# Patient Record
Sex: Female | Born: 1990 | Hispanic: Yes | Smoking: Never smoker
Health system: Southern US, Community
[De-identification: ages and names within clinical notes are randomized; demographics above are authoritative.]

## PROBLEM LIST (undated history)

## (undated) DIAGNOSIS — Z789 Other specified health status: Secondary | ICD-10-CM

## (undated) HISTORY — DX: Other specified health status: Z78.9

---

## 2012-01-04 ENCOUNTER — Inpatient Hospital Stay: Payer: Self-pay

## 2012-01-04 LAB — CBC WITH DIFFERENTIAL/PLATELET
Basophil %: 0.2 %
Eosinophil %: 0.6 %
HCT: 35.4 % (ref 35.0–47.0)
HGB: 12.3 g/dL (ref 12.0–16.0)
Lymphocyte #: 1.4 10*3/uL (ref 1.0–3.6)
Lymphocyte %: 12.1 %
MCH: 31.1 pg (ref 26.0–34.0)
MCHC: 34.8 g/dL (ref 32.0–36.0)
Monocyte #: 0.9 x10 3/mm (ref 0.2–0.9)
Monocyte %: 7.4 %
Neutrophil %: 79.7 %
Platelet: 168 10*3/uL (ref 150–440)
RBC: 3.96 10*6/uL (ref 3.80–5.20)

## 2012-01-07 LAB — CBC WITH DIFFERENTIAL/PLATELET
Basophil #: 0 10*3/uL (ref 0.0–0.1)
Basophil %: 0.1 %
Eosinophil #: 0 10*3/uL (ref 0.0–0.7)
HCT: 33.4 % — ABNORMAL LOW (ref 35.0–47.0)
HGB: 11.3 g/dL — ABNORMAL LOW (ref 12.0–16.0)
Lymphocyte %: 14.8 %
MCHC: 33.9 g/dL (ref 32.0–36.0)
Monocyte %: 11.3 %
Neutrophil #: 8.8 10*3/uL — ABNORMAL HIGH (ref 1.4–6.5)
Neutrophil %: 73.5 %
WBC: 12 10*3/uL — ABNORMAL HIGH (ref 3.6–11.0)

## 2012-01-09 LAB — CBC WITH DIFFERENTIAL/PLATELET
Eosinophil #: 0.1 10*3/uL (ref 0.0–0.7)
Lymphocyte %: 13.4 %
MCHC: 33.1 g/dL (ref 32.0–36.0)
MCV: 90 fL (ref 80–100)
Monocyte #: 1.2 x10 3/mm — ABNORMAL HIGH (ref 0.2–0.9)
Monocyte %: 8.5 %
Neutrophil %: 77 %
Platelet: 174 10*3/uL (ref 150–440)
RDW: 13.1 % (ref 11.5–14.5)
WBC: 14.6 10*3/uL — ABNORMAL HIGH (ref 3.6–11.0)

## 2012-01-10 LAB — HEMATOCRIT: HCT: 32 % — ABNORMAL LOW (ref 35.0–47.0)

## 2012-01-11 LAB — CBC WITH DIFFERENTIAL/PLATELET
Basophil #: 0 10*3/uL (ref 0.0–0.1)
Basophil %: 0.1 %
Eosinophil #: 0.2 10*3/uL (ref 0.0–0.7)
MCV: 90 fL (ref 80–100)
Monocyte #: 1.8 x10 3/mm — ABNORMAL HIGH (ref 0.2–0.9)
Monocyte %: 11 %
Neutrophil #: 13.1 10*3/uL — ABNORMAL HIGH (ref 1.4–6.5)
Neutrophil %: 79.6 %
Platelet: 165 10*3/uL (ref 150–440)
RBC: 3.4 10*6/uL — ABNORMAL LOW (ref 3.80–5.20)
RDW: 13.2 % (ref 11.5–14.5)
WBC: 16.4 10*3/uL — ABNORMAL HIGH (ref 3.6–11.0)

## 2012-01-17 ENCOUNTER — Emergency Department: Payer: Self-pay | Admitting: Emergency Medicine

## 2014-09-12 NOTE — Op Note (Signed)
PATIENT NAME:  Kristin Haley, Kristin Haley MR#:  [id number] DATE OF BIRTH:  07-20-1990  DATE OF PROCEDURE:  01/09/2012  PREOPERATIVE DIAGNOSES:   1. Premature preterm rupture of membranes. 2. 34 + 5 weeks. 3. Oligohydramnios. 4. Active labor. 5. Breech presentation.   POSTOPERATIVE DIAGNOSES:  1. Premature preterm rupture of membranes. 2. 34 + 5 weeks. 3. Oligohydramnios. 4. Active labor. 5. Breech presentation.   PROCEDURE: Primary low transverse cesarean section.   ANESTHESIA: Spinal.   SURGEON: Suzy Bouchardhomas J Schermerhorn, M.D.   FIRST ASSISTANT: Street, Stage managerscrub tech.   INDICATION: The patient is an 24 year old gravida 1, para 0 patient admitted to the hospital at 34 + 0 weeks with PPROM.  The patient was observed for the next five days and the day of the procedure the patient started contracting and continued to leak fluid. Ultrasound documented amniotic fluid index to be 4.1. Cervix was checked and cervix was 5 cm dilated, complete, -1 station. The fetus was in the frank breech presentation.   PROCEDURE: After adequate spinal anesthesia, the patient received 2 grams of IV Ancef prior to commencement of the case. A Pfannenstiel incision was made two fingerbreadths above the symphysis pubis. Sharp dissection was used to identify the fascia. The fascia was opened in the midline and opened in a transverse fashion. The superior aspect of the fascia was grasped with Kocher clamps and the recti muscles dissected free. The inferior aspect of the fascia was grasped with Kocher clamps. Pyramidalis muscle was dissected free. Entry into the peritoneal cavity was accomplished sharply. The vesicouterine peritoneal fold was identified and opened and the bladder was reflected inferiorly. A low transverse uterine incision was made. Upon entry into the endometrial cavity, clear fluid resulted. Fetal buttocks were was brought to the incision and legs were delivered from the frank breech position. Buttocks and legs were  grasped with a wound towel and the arms were delivered without difficulty. The head was delivered by the Mauriceau-Smellie-Veit maneuver. A small vigorous female's cord was doubly clamped and the infant was passed to Dr. Beckie Saltsasnadi who assigned Apgar scores of 9 and 9. The placenta was manually delivered. The uterus was exteriorized and the endometrial cavity was wiped clean with laparotomy tape. Ring forceps was used to open the cervix. This was passed off the operative field. The uterine incision was closed with 1 chromic suture in a running locking fashion with good approximation of edges. Good hemostasis was noted. The fallopian tubes and ovaries appeared normal bilaterally. The posterior cul-de-sac was irrigated and suctioned. The uterus was placed back into the abdominal cavity and good hemostasis again noted at the uterine incision. The paracolic gutters were wiped clean with laparotomy tape. The On-Q pump catheters were then brought up to the operative field and advanced inferior to the umbilicus. The fascia was then closed over the top of these previously placed catheters. The fascia was reapproximated. Good hemostasis and good approximation noted. Subcutaneous tissues were irrigated and bovied for hemostasis, and the skin was reapproximated with staples. The On-Q pump catheters were secured at the skin level with Dermabond and were attached to the skin with Steri-Strips. They were both loaded with 0.5% Marcaine, 5 mL each. Tegaderm was placed over the catheters. The patient tolerated the procedure well.  Estimated blood loss was 300 mL. Intraoperative fluids 1400 mL. There were no complications. The patient was taken to the recovery room in good condition.    ____________________________ Suzy Bouchardhomas J. Schermerhorn, MD tjs:bjt D: 01/09/2012 14:10:42 ET T: 01/09/2012 14:54:22 ET  JOB#: [id number]  cc: Suzy Bouchard, MD, <Dictator> Suzy Bouchard MD ELECTRONICALLY SIGNED 01/10/2012 11:19

## 2014-09-12 NOTE — Consult Note (Signed)
    Maternal Age 24    Gravida 1    Para 0    Term Deliveries 0    Preterm Deliveries 0    Abortions 0    Living Children 0    Final EDD (dd-mmm-yy) 16-Jan-2012    Gestational Age (wk, days based on mom's dates) 7734    Gestation Single    Blood Type (Maternal) O positive    Antibody Screen Results (Maternal) negative    Gonorrhea Results (Maternal) negative    Chlamydia Results (Maternal) negative    VDRL/RPR/Syphilis Results (Maternal) negative    Varicella Titer Results (Maternal) Positive    Rubella Results (Maternal) immune    Hepatitis B Surface Antigen Results (Maternal) negative    Group B Strep Results Maternal (Result >5wks must be treated as unknown) unknown/result > 5 weeks ago    Prenatal Care Adequate    Family/Social History Prenatal care adequate, but started late at 1220 wks gestation age.     Additional Comments Consulted to see this 24 yo G1PO mother who is in preterm premature labor with a 34 0/7 wks baby girl. I discussed with mother that the infant has a much greater than 90% chance of survival and a <10% chance of developing neurodevelopmental problems. We discussed that the eyes, lungs, and brain are all relatively well developed at this gestation age. We discussed the delivery, the possible need for resuscitation, the NICU, lines, endotracheal tubes, and ventilators. We discussed that the majority of this infants will have difficulty Po feeding at this age and that this would be the major hurdle to overcome. We discussed the length of admission 2 weeks plus/minus her due date. I answered all of the parents and grandparents questions. During my consult we used a translator to help with the grandparents understanding. Father is present but does not speak [place].  Thank you for the opportunity to consult with this wonderful family.   Recommendations to OBGYN 1. Betamethasone x 2 if possible 2. No need for magnesium for neuroprotection since infant is >  [redacted] weeks gestation age.  Total time 45 minutes.    Parental Contact Consulted with Obstetrician regarding treatment options, Parents aware of plan and care   Electronic Signatures: Corliss ParishGaliote, Chandrea Zellman P (MD)  (Signed 12-Aug-13 00:19)  Authored: PREGNANCY and LABOR, ADDITIONAL COMMENTS   Last Updated: 12-Aug-13 00:19 by Corliss ParishGaliote, Lyndell Allaire P (MD)

## 2014-09-12 NOTE — Discharge Summary (Signed)
PATIENT NAME:  Kristin Haley, Kristin Haley MR#:  [id number] DATE OF BIRTH:  1991/05/06  DATE OF ADMISSION:  01/04/2012 DATE OF DISCHARGE:  01/12/2012  HOSPITAL COURSE: 24 year old gravida 1, para 0 admitted to [facility], had preterm rupture of membranes at 34 + 5 weeks. The patient was observed for five days. On the day of surgery, the patient was contracting. Cervix was dilated to 5 cm. Amniotic fluid index of 4.1. Fetus in the frank breech presentation. The patient's white blood count was elevated.  PRINCIPLE PROCEDURE: Primary low transverse cesarean section.   Postoperatively, the patient did well. The patient remained afebrile for postoperative course. Postoperative hematocrit 32%. The patient was discharged to home in good condition on postoperative day #3. The patient will follow-up with Dr. Feliberto GottronSchermerhorn in two weeks for wound care.  DISCHARGE MEDICATIONS:  1. Prenatal vitamins. 2. Norco. 3. Ibuprofen.   The patient will return before two weeks for nausea, vomiting, increasing abdominal pain or fever.   ____________________________ Suzy Bouchardhomas J. Enna Warwick, MD tjs:ap D: 01/19/2012 10:21:36 ET T: 01/19/2012 13:02:45 ET JOB#: [id number]  cc: Suzy Bouchardhomas J. Raunel Dimartino, MD, <Dictator>  Suzy BouchardHOMAS J Bay Jarquin MD ELECTRONICALLY SIGNED 01/21/2012 8:58

## 2014-10-03 NOTE — H&P (Signed)
L&D Evaluation:  History:   HPI 24 y/o G1 @ 34wks EDC 02/16/12 (dated by 24 wk US) arrives with c/o leaking large amount of fluid at 2030. Irreg mild uc's, denies vaginal bleeding, baby is active. Care @ [facility], adolescent, late entry to care @ 20 wks, GBS unknown.    Presents with contractions, leaking fluid    Patient's Medical History No Chronic Illness    Patient's Surgical History none    Medications Pre Natal Vitamins    Allergies NKDA    Social History none    Family History Non-Contributory   Exam:   Vital Signs stable    Urine Protein not completed    General no apparent distress    Mental Status clear    Chest clear    Heart normal sinus rhythm    Abdomen gravid, non-tender    Estimated Fetal Weight Average for gestational age    Fetal Position vtx    Fundal Height AGA    Edema no edema    Reflexes 1+    Clonus negative    Pelvic no external lesions, 1cm 50% vtx @ -2 cleear fluid sm show    Description clear    FHT normal rate with no decels, 150's 160's baseline avg variability    Fetal Heart Rate 152    Ucx irregular, Q 2/3 mins 45 sec mild/mod    Skin dry    Lymph no lymphadenopathy   Impression:   Impression early labor, PPROM   Plan:   Plan EFM/NST, antibiotics for GBBS prophylaxis    Comments Admitted, explained plan of care and what to expect with preterm delivery and first baby. Questions answered for pt and family. DC pain management options. Dr Logan BoresEvans consulted, Dr Kurtis BushmanGalliotte consulted as well. Betamethasone given, IV ABX begun. No SVE.   Electronic Signatures: Albertina ParrLugiano, Inita Uram B (CNM)  (Signed 11-Aug-13 22:36)  Authored: L&D Evaluation   Last Updated: 11-Aug-13 22:36 by Albertina ParrLugiano, Thelmer Legler B (CNM)

## 2016-06-16 LAB — HM PAP SMEAR: HM Pap smear: NEGATIVE

## 2017-12-29 ENCOUNTER — Other Ambulatory Visit: Payer: Self-pay | Admitting: Nurse Practitioner

## 2017-12-29 DIAGNOSIS — Z3481 Encounter for supervision of other normal pregnancy, first trimester: Secondary | ICD-10-CM | POA: Diagnosis not present

## 2017-12-29 DIAGNOSIS — Z369 Encounter for antenatal screening, unspecified: Secondary | ICD-10-CM

## 2018-01-28 ENCOUNTER — Other Ambulatory Visit: Payer: Self-pay | Admitting: Obstetrics and Gynecology

## 2018-01-28 DIAGNOSIS — Z369 Encounter for antenatal screening, unspecified: Secondary | ICD-10-CM

## 2018-02-01 ENCOUNTER — Ambulatory Visit
Admission: RE | Admit: 2018-02-01 | Discharge: 2018-02-01 | Disposition: A | Discharge status: Home / Self Care | Payer: 59 | Source: Ambulatory Visit | Attending: Obstetrics and Gynecology | Admitting: Obstetrics and Gynecology

## 2018-02-01 ENCOUNTER — Ambulatory Visit: Payer: Self-pay

## 2018-02-01 ENCOUNTER — Ambulatory Visit
Admission: RE | Admit: 2018-02-01 | Discharge: 2018-02-01 | Disposition: A | Discharge status: Home / Self Care | Payer: 59 | Source: Ambulatory Visit | Attending: Nurse Practitioner | Admitting: Nurse Practitioner

## 2018-02-01 DIAGNOSIS — Z3689 Encounter for other specified antenatal screening: Secondary | ICD-10-CM | POA: Diagnosis not present

## 2018-02-01 DIAGNOSIS — Z315 Encounter for genetic counseling: Secondary | ICD-10-CM | POA: Insufficient documentation

## 2018-02-01 DIAGNOSIS — Z87898 Personal history of other specified conditions: Secondary | ICD-10-CM

## 2018-02-01 DIAGNOSIS — Z3A13 13 weeks gestation of pregnancy: Secondary | ICD-10-CM | POA: Insufficient documentation

## 2018-02-01 DIAGNOSIS — Z369 Encounter for antenatal screening, unspecified: Secondary | ICD-10-CM

## 2018-02-01 NOTE — Progress Notes (Signed)
Length of Consultation: 30 minutes   Kristin Haley  was referred to [facility] for genetic counseling to review prenatal screening and testing options.  This note summarizes the information we discussed.    Genetic Counseling We offered the following routine screening tests for this pregnancy:  First trimester screening, which includes nuchal translucency ultrasound screen and first trimester maternal serum marker screening.  The nuchal translucency has approximately an 80% detection rate for Down syndrome and can be positive for other chromosome abnormalities as well as congenital heart defects.  When combined with a maternal serum marker screening, the detection rate is up to 90% for Down syndrome and up to 97% for trisomy 18.     Maternal serum marker screening, a blood test that measures pregnancy proteins, can provide risk assessments for Down syndrome, trisomy 18, and open neural tube defects (spina bifida, anencephaly). Because it does not directly examine the fetus, it cannot positively diagnose or rule out these problems.  Targeted ultrasound uses high frequency sound waves to create an image of the developing fetus.  An ultrasound is often recommended as a routine means of evaluating the pregnancy.  It is also used to screen for fetal anatomy problems (for example, a heart defect) that might be suggestive of a chromosomal or other abnormality.   Should these screening tests indicate an increased concern, then the following additional testing options would be offered:  The chorionic villus sampling procedure is available for first trimester chromosome analysis.  This involves the withdrawal of a small amount of chorionic villi (tissue from the developing placenta).  Risk of pregnancy loss is estimated to be approximately 1 in 200 to 1 in 100 (0.5 to 1%).  There is approximately a 1% (1 in 100) chance that the CVS chromosome results will be unclear.  Chorionic  villi cannot be tested for neural tube defects.     Amniocentesis involves the removal of a small amount of amniotic fluid from the sac surrounding the fetus with the use of a thin needle inserted through the maternal abdomen and uterus.  Ultrasound guidance is used throughout the procedure.  Fetal cells from amniotic fluid are directly evaluated and > 99.5% of chromosome problems and > 98% of open neural tube defects can be detected. This procedure is generally performed after the 15th week of pregnancy.  The main risks to this procedure include complications leading to miscarriage in less than 1 in 200 cases (0.5%).  As another option for information if the pregnancy is suspected to be an an increased chance for certain chromosome conditions, we also reviewed the availability of cell free fetal DNA testing from maternal blood to determine whether or not the baby may have either Down syndrome, trisomy 49, or trisomy 49.  This test utilizes a maternal blood sample and DNA sequencing technology to isolate circulating cell free fetal DNA from maternal plasma.  The fetal DNA can then be analyzed for DNA sequences that are derived from the three most common chromosomes involved in aneuploidy, chromosomes 13, 18, and 21.  If the overall amount of DNA is greater than the expected level for any of these chromosomes, aneuploidy is suspected.  While we do not consider it a replacement for invasive testing and karyotype analysis, a negative result from this testing would be reassuring, though not a guarantee of a normal chromosome complement for the baby.  An abnormal result is certainly suggestive of an abnormal chromosome complement, though we would  still recommend CVS or amniocentesis to confirm any findings from this testing.  Cystic Fibrosis and Spinal Muscular Atrophy (SMA) screening were also discussed with the patient. These conditions are recessive, which means that both parents must be carriers in order to  have a child with the disease.  Cystic fibrosis (CF) is one of the most common genetic conditions in persons of Caucasian ancestry.  This condition results in thickened secretions in the lungs, digestive, and reproductive systems.  For a baby to be at risk for having CF, both of the parents must be carriers for this condition.  Approximately 1 in 13 Hispanic persons is a carrier for CF.  Current carrier testing looks for the most common mutations in the gene for CF and can detect approximately 72% of carriers in the Hispanic population.  This means that the carrier screening can greatly reduce, but cannot eliminate, the chance for an individual to have a child with CF.  If an individual is found to be a carrier for CF, then carrier testing would be available for the partner. As part of [place]'s newborn screening profile, all babies born in the state of [place] will have a two-tier screening process.  Specimens are first tested to determine the concentration of immunoreactive trypsinogen (IRT).  The top 5% of specimens with the highest IRT values then undergo DNA testing using a panel of over 40 common CF mutations. SMA is a neurodegenerative disorder that leads to atrophy of skeletal muscle and overall weakness.  In the Hispanc[place] population 1 in 117 persons are carriers for SMA, with an approximately 91% detection rate. There are multiple forms of the disease, with some causing death in infancy to other forms with survival into adulthood.  The genetics of SMA is complex, but carrier screening can detect up to 95% of carriers in the Caucasian population.  Similar to CF, a negative result can greatly reduce, but cannot eliminate, the chance to have a child with SMA.   Hemoglobinopathy carrier testing was also discussed. Hemoglobinopathies include conditions such as sickle cell anemia, alpha thalassemia, and beta thalassemia that involve disordered hemoglobin structure. Sickle cell anemia impacts blood  cell structure, which can lead to vasoocclusive crises and acute chest syndrome. Thalassemias are disorders of reduced globin chain formation, and features vary by genotype. These are autosomal recessive conditions, which means both parents must be carriers in order to have a child with the disease. Individuals who are carriers of two different hemoglobin structure abnormalities, such as having sickle cell trait and be a carrier for beta thalassemia, may also present with abnormal phenotypes. If an individual is found to be a carrier for one of these conditions, then testing in their partner would be available. A negative result can greatly reduce, but cannot eliminate, the chance to have a child with a hemoglobinopathy. All babies in [place] are screened at birth for abnormal hemoglobin S, C, D, and E using isoelectric focusing and high performance liquid chromatography. 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   Pregnancy/Family History We obtained a detailed family history and pregnancy history. This is Kristin Haley second pregnancy, G2P0101. Kristin Haley reported no pregnancy complications such as bleeding or infection. She reported no drug, tobacco, or alcohol use during pregnancy. Kristin Haley current partner has no other children, and manages his hypertension via medication. Her first pregnancy was with a different partner. Her daughter was born  preterm, but is now six with no reported concerns. The remainder of the family history was reported to be unremarkable for birth defects, intellectual delays, recurrent pregnancy loss or known chromosome abnormalities. Kristin Haley and her partner are both Hispanic. Consanguinity is denied.   Plan After consideration of the options, Kristin Haley elected to proceed with first trimester screening, and carrier screening for CF, SMA, and hemoglobinopathies. Results will be disclosed over the  phone with Kristin Haley.   An ultrasound was performed at the time of the visit.  The gestational age was consistent with [redacted]w[redacted]d.  The nuchal translucency measured 2.80mm. Fetal anatomy could not be assessed due to early gestational age.  Please refer to the ultrasound report for details of that study.  Kristin Haley was encouraged to call with questions or concerns.  We can be contacted at [phone].  Labs ordered: -First trimester screen -Carrier screening for SMA, CF, hemoglobinopathies   I saw the patient and agree with plan as outlined by the genetic counselor  Jimmey Ralph, MD

## 2018-02-02 LAB — HEMOGLOBINOPATHY EVALUATION
HGB A2 QUANT: 2.4 % (ref 1.8–3.2)
HGB F QUANT: 0 % (ref 0.0–2.0)
Hgb A: 97.6 % (ref 96.4–98.8)
Hgb C: 0 %
Hgb S Quant: 0 %
Hgb Variant: 0 %

## 2018-02-04 ENCOUNTER — Telehealth: Payer: Self-pay | Admitting: Obstetrics and Gynecology

## 2018-02-04 NOTE — Telephone Encounter (Signed)
  Ms. Irving ShowsGuido elected to undergo First Trimester screening on 02/01/2018.  To review, first trimester screening, includes nuchal translucency ultrasound screen and/or first trimester maternal serum marker screening.  The nuchal translucency has approximately an 80% detection rate for Down syndrome and can be positive for other chromosome abnormalities as well as heart defects.  When combined with a maternal serum marker screening, the detection rate is up to 90% for Down syndrome and up to 97% for trisomy 13 and 18.     The results of the First Trimester Nuchal Translucency and Biochemical Screening were within normal range.  The risk for Down syndrome is now estimated to be less than 1 in 10,000.  The risk for Trisomy 13/18 is also estimated to be less than 1 in 10,000.  Should more definitive information be desired, we would offer amniocentesis.  Because we do not yet know the effectiveness of combined first and second trimester screening, we do not recommend a maternal serum screen to assess the chance for chromosome conditions.  However, if screening for neural tube defects is desired, maternal serum screening for AFP only can be performed between 15 and [redacted] weeks gestation.    The patient also elected to have carrier screening for hemoglobinopathies, CF and SMA.  The results of the hemoglobinopathy testing are available and are normal, showing normal adult hemoglobin (AA).  The results of the CF and SMA testing should be available within 2 weeks and will be conveyed to the patient.  Cherly Andersoneborah F. Tearah Saulsbury, MS, CGC

## 2018-02-09 LAB — SMN1 COPY NUMBER ANALYSIS (SMA CARRIER SCREENING)

## 2018-02-09 LAB — CYSTIC FIBROSIS GENE TEST

## 2018-02-11 ENCOUNTER — Telehealth: Payer: Self-pay | Admitting: Obstetrics and Gynecology

## 2018-02-11 NOTE — Telephone Encounter (Signed)
  We have informed Ms. Kristin Haley that the results of the recent screening tests for Cystic fibrosis (CF) and Spinal Muscular Atrophy (SMA) are now available and are within normal limits.    To review, CF is a genetic condition that occurs most often in Caucasian persons.  It primarily affects the lungs, digestive, and reproductive systems.  For someone to be at risk for having CF, both of their parents must be carriers for CF.  The testing can detect many persons who are carriers for CF and therefore determine if the pregnancy is at an increased risk for this condition.  The blood test results were negative when examined for the 32 most common mutations (or changes) in the gene for CF.  This means that she does not carry any of the most common changes in this gene.  Testing for these 32 mutations detects approximately 73% of carriers who are Hispanic.  Therefore, the chance that she is a carrier based on this negative result has been reduced from 1 in 46 to approximately 1 in 168.  Because this testing cannot detect all changes that may cause CF, we cannot eliminate the chance that this individual is a carrier completely.  The results of the SMA carrier screening are also available.  SMA is also a recessive genetic condition with variable age of onset and severity caused by mutations in the SMN1 gene.  This carrier testing assesses the number of copies of this gene.  Persons with one copy of the SMN1 gene are carriers, and those with no copies are affected with the condition.  Individuals with two or more copies have a reduced chance to be a carrier.  Not all mutations can be detected with this testing, though it can detect 90% of carriers in the Hispanic population.  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  Again, this testing cannot eliminate the chance to have a child with SMA, but dramatically reduces the chance.    We  encouraged the patient to call with any questions or concerns as they arise.  We may be reached at [phone].  Cherly Andersoneborah F. Aalijah Mims, MS, CGC

## 2018-02-23 DIAGNOSIS — O0992 Supervision of high risk pregnancy, unspecified, second trimester: Secondary | ICD-10-CM | POA: Diagnosis not present

## 2018-03-04 DIAGNOSIS — O0990 Supervision of high risk pregnancy, unspecified, unspecified trimester: Secondary | ICD-10-CM | POA: Diagnosis not present

## 2018-03-04 DIAGNOSIS — O0992 Supervision of high risk pregnancy, unspecified, second trimester: Secondary | ICD-10-CM | POA: Diagnosis not present

## 2018-03-08 ENCOUNTER — Ambulatory Visit
Admission: RE | Admit: 2018-03-08 | Discharge: 2018-03-08 | Disposition: A | Discharge status: Home / Self Care | Payer: 59 | Source: Ambulatory Visit | Attending: Obstetrics & Gynecology | Admitting: Obstetrics & Gynecology

## 2018-03-08 DIAGNOSIS — Z369 Encounter for antenatal screening, unspecified: Secondary | ICD-10-CM

## 2018-03-08 DIAGNOSIS — Z87898 Personal history of other specified conditions: Secondary | ICD-10-CM

## 2018-04-01 DIAGNOSIS — O0990 Supervision of high risk pregnancy, unspecified, unspecified trimester: Secondary | ICD-10-CM | POA: Diagnosis not present

## 2018-04-29 DIAGNOSIS — O0990 Supervision of high risk pregnancy, unspecified, unspecified trimester: Secondary | ICD-10-CM | POA: Diagnosis not present

## 2018-05-27 LAB — HM HIV SCREENING LAB: HM HIV Screening: NEGATIVE

## 2018-06-10 DIAGNOSIS — O0990 Supervision of high risk pregnancy, unspecified, unspecified trimester: Secondary | ICD-10-CM | POA: Diagnosis not present

## 2018-06-24 DIAGNOSIS — O0990 Supervision of high risk pregnancy, unspecified, unspecified trimester: Secondary | ICD-10-CM | POA: Diagnosis not present

## 2018-07-08 DIAGNOSIS — O0990 Supervision of high risk pregnancy, unspecified, unspecified trimester: Secondary | ICD-10-CM | POA: Diagnosis not present

## 2018-07-15 DIAGNOSIS — O0990 Supervision of high risk pregnancy, unspecified, unspecified trimester: Secondary | ICD-10-CM | POA: Diagnosis not present

## 2018-08-05 DIAGNOSIS — O3429 Maternal care due to uterine scar from other previous surgery: Secondary | ICD-10-CM | POA: Diagnosis not present

## 2018-08-05 DIAGNOSIS — Z6790 Unspecified blood type, Rh positive: Secondary | ICD-10-CM | POA: Diagnosis not present

## 2018-08-05 DIAGNOSIS — E663 Overweight: Secondary | ICD-10-CM | POA: Diagnosis not present

## 2018-08-05 DIAGNOSIS — O0990 Supervision of high risk pregnancy, unspecified, unspecified trimester: Secondary | ICD-10-CM | POA: Diagnosis not present

## 2018-08-08 DIAGNOSIS — Z3A39 39 weeks gestation of pregnancy: Secondary | ICD-10-CM | POA: Diagnosis not present

## 2018-08-08 DIAGNOSIS — O26893 Other specified pregnancy related conditions, third trimester: Secondary | ICD-10-CM | POA: Diagnosis not present

## 2018-08-08 DIAGNOSIS — O34219 Maternal care for unspecified type scar from previous cesarean delivery: Secondary | ICD-10-CM | POA: Diagnosis not present

## 2018-08-08 DIAGNOSIS — M545 Low back pain: Secondary | ICD-10-CM | POA: Diagnosis not present

## 2018-08-08 DIAGNOSIS — Z3A40 40 weeks gestation of pregnancy: Secondary | ICD-10-CM | POA: Diagnosis not present

## 2018-08-10 DIAGNOSIS — Z3A40 40 weeks gestation of pregnancy: Secondary | ICD-10-CM | POA: Diagnosis not present

## 2018-08-10 DIAGNOSIS — O34211 Maternal care for low transverse scar from previous cesarean delivery: Secondary | ICD-10-CM | POA: Diagnosis not present

## 2018-12-03 ENCOUNTER — Other Ambulatory Visit: Payer: Self-pay

## 2018-12-03 ENCOUNTER — Ambulatory Visit (LOCAL_COMMUNITY_HEALTH_CENTER): Payer: 59 | Admitting: Physician Assistant

## 2018-12-03 ENCOUNTER — Encounter: Payer: Self-pay | Admitting: Physician Assistant

## 2018-12-03 VITALS — BP 105/62 | Ht 64.0 in | Wt 166.0 lb

## 2018-12-03 DIAGNOSIS — Z30431 Encounter for routine checking of intrauterine contraceptive device: Secondary | ICD-10-CM

## 2018-12-03 DIAGNOSIS — Z3009 Encounter for other general counseling and advice on contraception: Secondary | ICD-10-CM

## 2018-12-03 NOTE — Progress Notes (Signed)
Family Planning Visit- Repeat Yearly Visit  Subjective:  Kristin Haley is a 28 y.o. being seen today for an well woman visit and to discuss family planning options.    She is currently using IUD for pregnancy prevention. Patient reports she does not  want a pregnancy in the next year. Patient has the following medical conditionshas Prenatal genetic counseling on their problem list.  Chief Complaint  Patient presents with  . Contraception    wants IUD string check    Patient reports that she is not sure if she is feeling her IUD strings and would like to have it checked.  Denies vaginal s/s and declines STD testing today. Patient denies any concerns today.   Does the patient desire a pregnancy in the next year? (OKQ flowsheet)  See flowsheet for other program required questions.   Body mass index is 28.49 kg/m. - Patient is eligible for diabetes screening based on BMI and age >06?  not applicable CB7S ordered? not applicable   Patient reports 1 of partners in last year. Desires STI screening?  No - .  Does the patient have a current or past history of drug use? No   No components found for: HCV]   Health Maintenance Due  Topic Date Due  . HIV Screening  02/06/2008  . TETANUS/TDAP  02/06/2012  . PAP-Cervical Cytology Screening  02/05/2014  . PAP SMEAR-Modifier  02/05/2014    ROS  The following portions of the patient's history were reviewed and updated as appropriate: allergies, current medications, past family history, past medical history, past social history, past surgical history and problem list. Problem list updated.  Objective:   Vitals:   12/03/18 0909  BP: 105/62  Weight: 166 lb (75.3 kg)  Height: 5\' 4"  (1.626 m)    Physical Exam Constitutional:      General: She is not in acute distress.    Appearance: Normal appearance.  Pulmonary:     Effort: Pulmonary effort is normal.  Abdominal:     Palpations: Abdomen is soft. There is no mass.     Tenderness:  There is no abdominal tenderness. There is no guarding or rebound.  Genitourinary:    General: Normal vulva.     Rectum: Normal.     Comments: External genitalia is normal female without edema, erythema, nits, lice, lesions or inguinal adenopathy Vagina with normal mucosa, small amount of brownish blood, cervix without visible lesions, IUD strings visualized tucked behind cervix Uterus normal size, firm, mobile,nt, no CMT, no masses, no adnexal tenderness or fullness,  IUD strings palpated. Neurological:     Mental Status: She is alert and oriented to person, place, and time.  Psychiatric:        Mood and Affect: Mood normal.        Behavior: Behavior normal.        Thought Content: Thought content normal.        Judgment: Judgment normal.       Assessment and Plan:  Kristin Haley is a 28 y.o. female presenting to the [facility] Department for an initial well woman exam/family planning visit  Contraception counseling: Reviewed all forms of birth control options available including abstinence; over the counter/barrier methods; hormonal contraceptive medication including pill, patch, ring, injection,contraceptive implant; hormonal and nonhormonal IUDs; permanent sterilization options including vasectomy and the various tubal sterilization modalities. Risks and benefits reviewed.  Questions were answered.  Written information was also given to the patient to review.  Patient desires to  continue with the Mirena IUD, this was prescribed for patient. She will follow up in as needed for surveillance.  She was told to call with any further questions, or with any concerns about this method of contraception.  Emphasized use of condoms 100% of the time for STI prevention.  1.  Contraceptive management/counseling Reassured patient that IUD is in place. Reviewed with patient normal SE of the IUD and when to be concerned about irregular bleeding Rec that patient check for her IUD strings at  least every 2-3 months and especially if having any unusual bleeding or cramping RTC prn and 08/2019 for RP.    No follow-ups on file.  No future appointments.  Matt Holmesarla J Hampton, PA

## 2020-06-21 IMAGING — US US MFM OB COMP +14 WKS
1 series · 13 of 28 positions shown · non-contrast
Comparison: none

PATIENT INFO:

PERFORMED BY:
CORDON
SERVICE(S) PROVIDED:
INDICATIONS:
18 weeks gestation of pregnancy
FETAL EVALUATION:
Num Of Fetuses:         1
Fetal Heart Rate(bpm):  150
Cardiac Activity:       Present
Presentation:           Vertex
Placenta:               Anterior
Largest Pocket(cm)
3.85
RUQ(cm)
BIOMETRY:
BPD:      40.9  mm     G. Age:  18w 3d         69  %    CI:         73.9   %    70 - 86
FL/HC:      17.4   %    15.8 - 18
HC:      151.1  mm     G. Age:  18w 1d         50  %
FL/BPD:     64.3   %
FL:       26.3  mm     G. Age:  18w 0d         45  %
HUM:      24.7  mm     G. Age:  17w 5d         47  %
CER:      17.2  mm     G. Age:  17w 1d         32  %
NFT:       3.9  mm
CM:        5.2  mm
GESTATIONAL AGE:
LMP:           18w 0d        Date:  11/02/17                 EDD:   08/09/18
U/S Today:     18w 1d                                        EDD:   08/08/18
Best:          18w 0d     Det. By:  LMP  (11/02/17)          EDD:   08/09/18
ANATOMY:
Cranium:               Within Normal Limits   Aortic Arch:            Normal appearance
Cavum:                 CSP visualized         Ductal Arch:            Normal appearance
Ventricles:            Normal appearance      Diaphragm:              Within Normal Limits
Choroid Plexus:        Within Normal Limits   Stomach:                Seen
Cerebellum:            Within Normal Limits   Abdomen:                Within Normal
Limits
Posterior Fossa:       Within Normal Limits   Abdominal Wall:         Normal appearance
Nuchal Fold:           Within Normal Limits   Cord Vessels:           3 vessels
Face:                  Orbits visualized      Kidneys:                Normal appearance
Lips:                  Normal appearance      Bladder:                Seen
Thoracic:              Within Normal Limits   Spine:                  Normal appearance
Heart:                 4-Chamber view         Upper Extremities:      Visualized
appears normal
RVOT:                  Normal appearance      Lower Extremities:      Visualized
LVOT:                  Normal appearance
CERVIX UTERUS ADNEXA:
Cervix
Length:           3.52  cm.

[Series 1: us mfm ob comp +14 wks · 0.25mm/px · 13 of 83 slices shown]
[im 4/83]
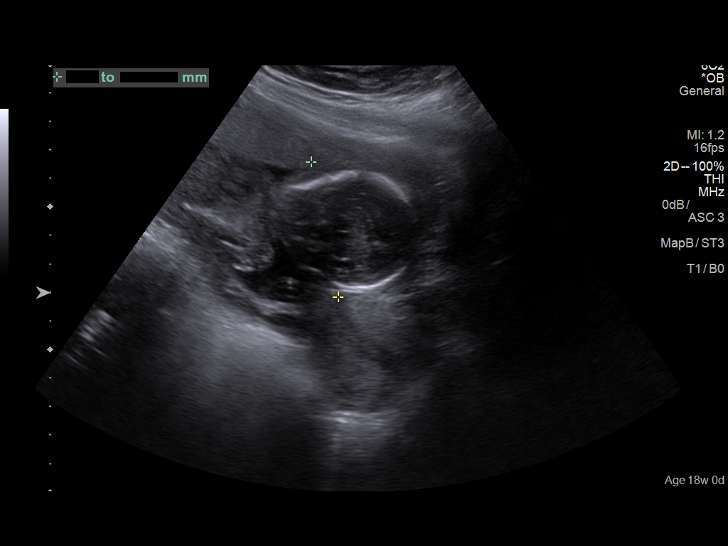
[im 10/83]
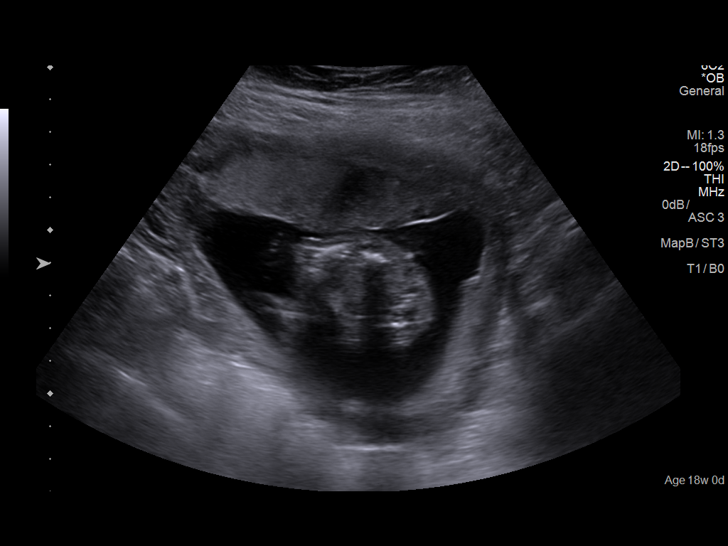
[im 16/83]
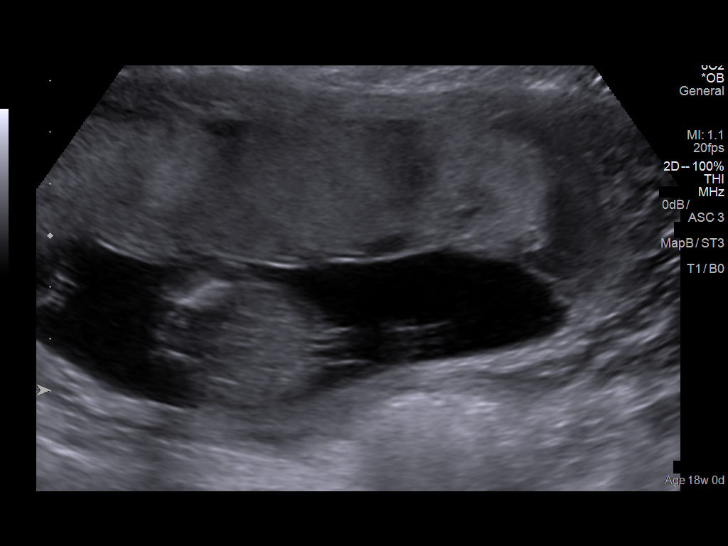
[im 22/83]
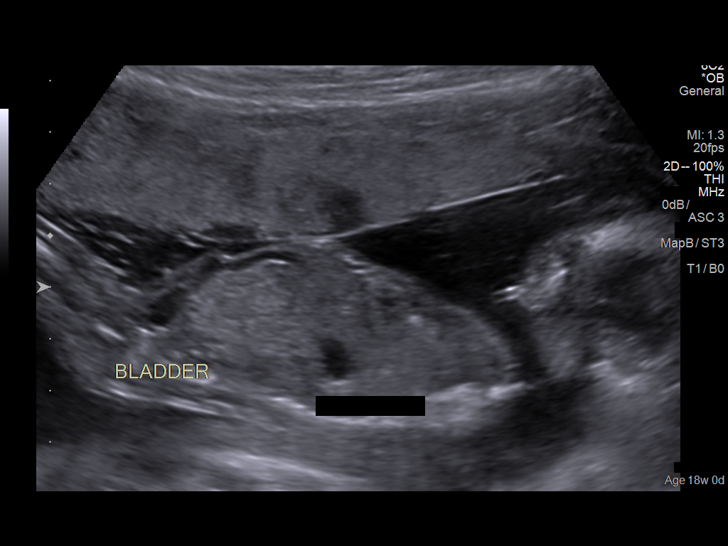
[im 28/83]
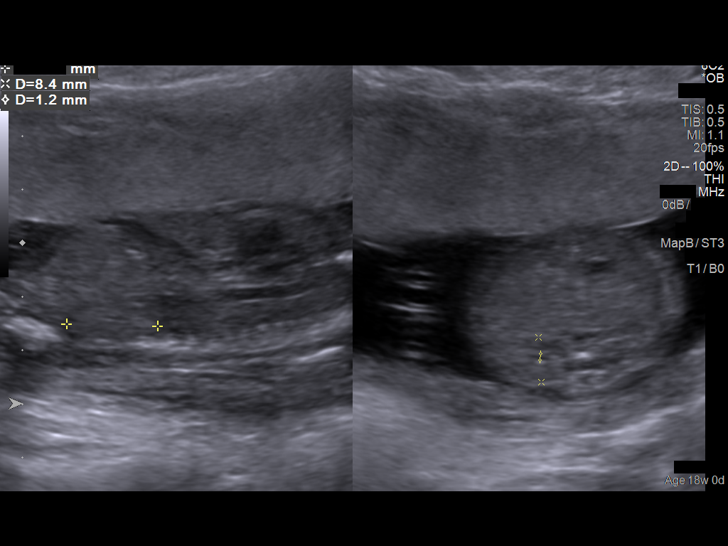
[im 34/83]
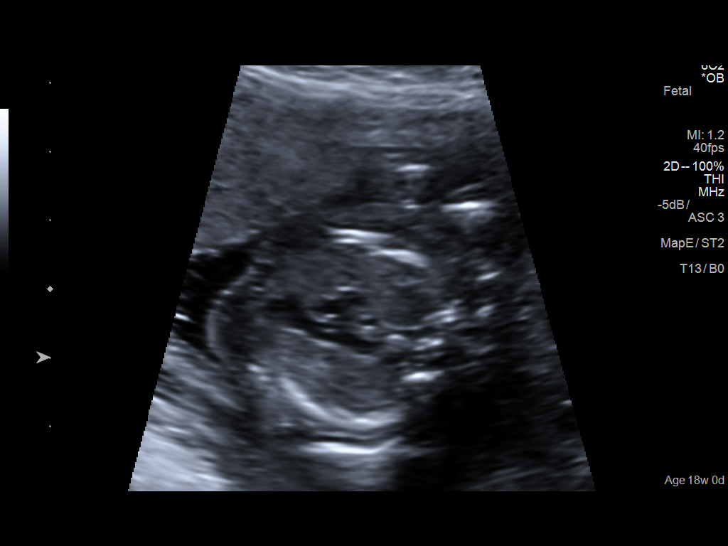
[im 43/83]
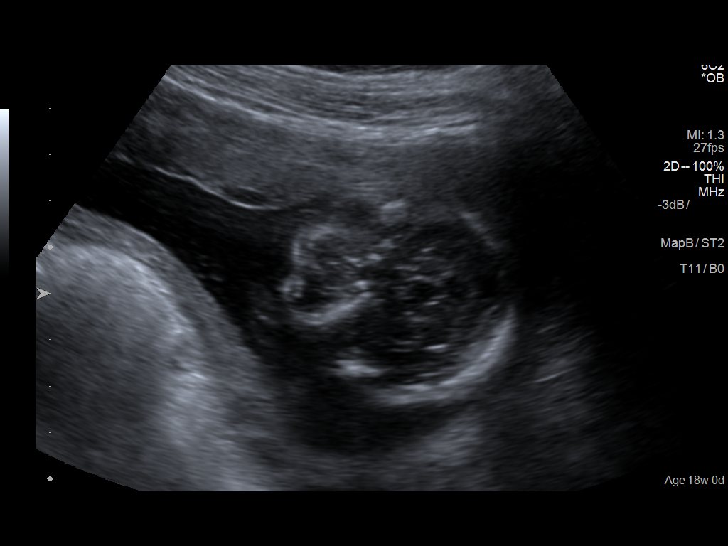
[im 49/83]
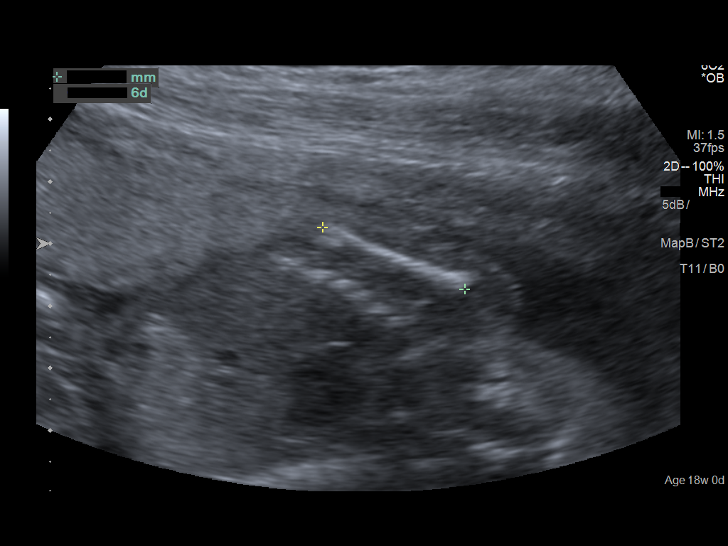
[im 55/83]
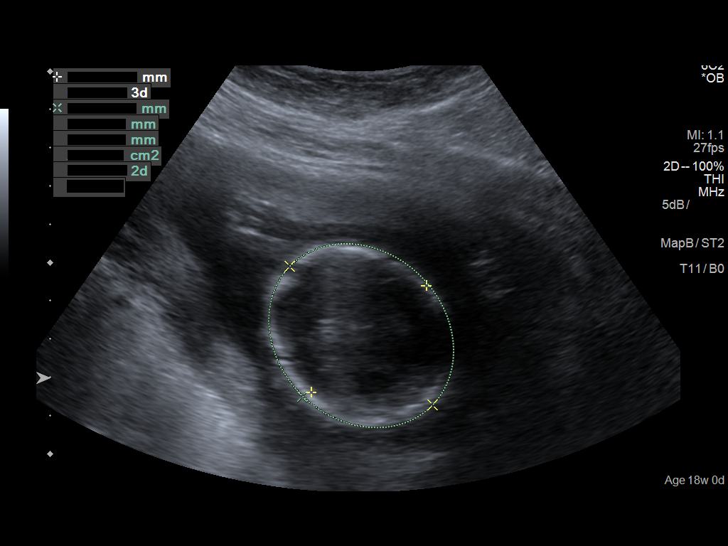
[im 61/83]
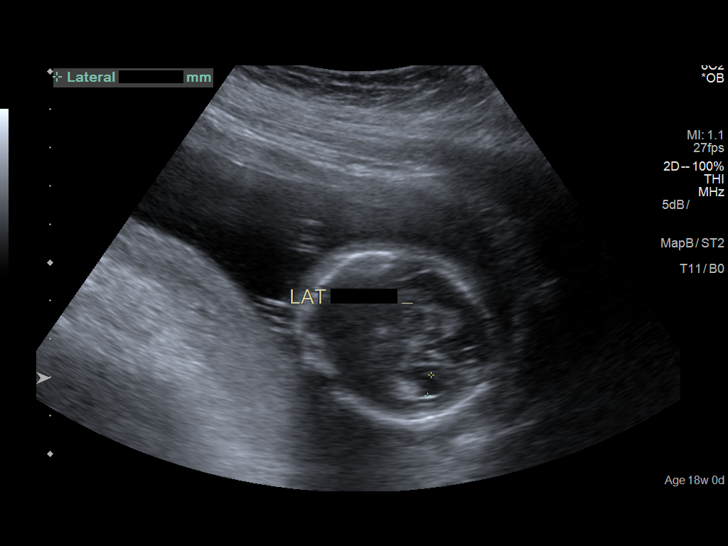
[im 67/83]
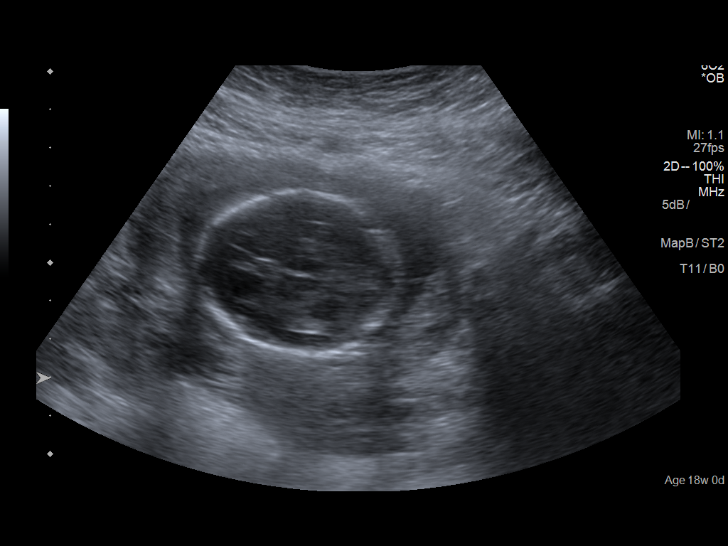
[im 73/83]
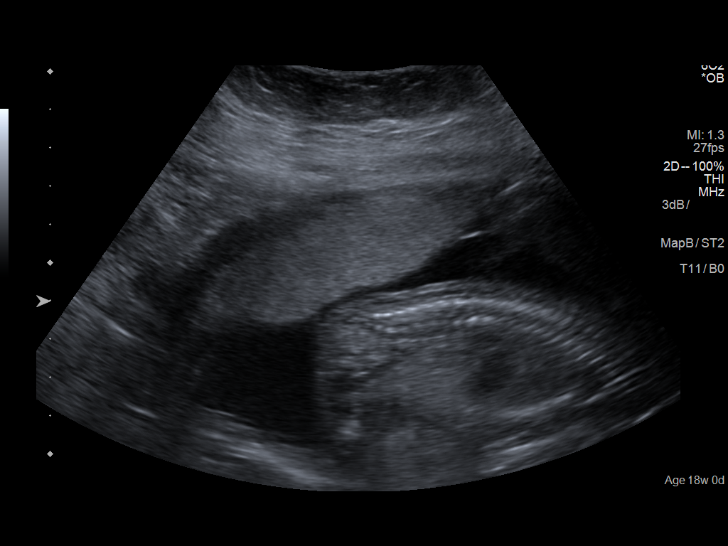
[im 79/83]
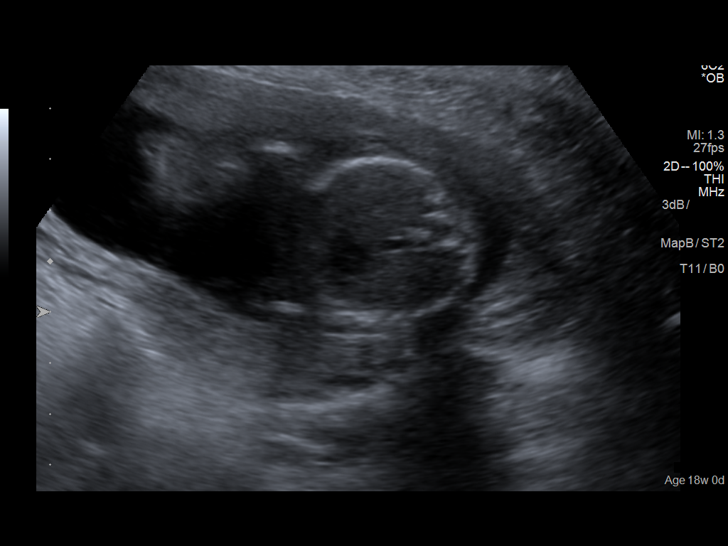

[13 of 28 positions shown; findings below may reference images not displayed]

IMPRESSION: Dear Dr.   CORDON,

[facility] for a fetal anatomical survey.

There is a singleton gestation with subjectively normal
amniotic fluid volume.

The fetal biometry correlates with established dating.

The fetal anatomical survey appears within normal limits
within the resolution of ultrasound.  Ms. Jumper previously had
normal first trimester screening.  It must be noted that a
normal ultrasound cannot rule out aneuploidy.

Thank you for allowing us to participate in your patient's care.

assistance.

## 2020-09-24 ENCOUNTER — Encounter: Payer: Self-pay | Admitting: Advanced Practice Midwife

## 2020-09-24 ENCOUNTER — Other Ambulatory Visit: Payer: Self-pay

## 2020-09-24 ENCOUNTER — Ambulatory Visit (LOCAL_COMMUNITY_HEALTH_CENTER): Payer: 59 | Admitting: Physician Assistant

## 2020-09-24 VITALS — BP 105/69 | HR 68 | Ht 64.0 in | Wt 177.4 lb

## 2020-09-24 DIAGNOSIS — Z3009 Encounter for other general counseling and advice on contraception: Secondary | ICD-10-CM

## 2020-09-24 DIAGNOSIS — Z113 Encounter for screening for infections with a predominantly sexual mode of transmission: Secondary | ICD-10-CM

## 2020-09-24 DIAGNOSIS — Z01419 Encounter for gynecological examination (general) (routine) without abnormal findings: Secondary | ICD-10-CM

## 2020-09-24 DIAGNOSIS — Z30431 Encounter for routine checking of intrauterine contraceptive device: Secondary | ICD-10-CM | POA: Diagnosis not present

## 2020-09-25 ENCOUNTER — Encounter: Payer: Self-pay | Admitting: Physician Assistant

## 2020-09-25 NOTE — Progress Notes (Signed)
[facility] DEPARTMENT [facility] [address] Main Number: [phone]    Family Planning Visit- Initial Visit  Subjective:  Kristin Haley is a 30 y.o.  G2P2001   being seen today for an initial annual visit and to discuss contraceptive options.  The patient is currently using IUD Mirena for pregnancy prevention. Patient reports she does not want a pregnancy in the next year.  Patient has the following medical conditions has Prenatal genetic counseling on their problem list.  Chief Complaint  Patient presents with  . Contraception    Annual exam and cont with IUD    Patient reports that she is doing well with the IUD and desires to continue with this as her BCM.  States that she wants a physical since it has been a while since she had one.  Per chart review, CBE and pap due today.    Patient denies any concerns today.    Body mass index is 30.45 kg/m. - Patient is eligible for diabetes screening based on BMI and age >21?  not applicable HA1C ordered? not applicable  Patient reports 1  partner/s in last year. Desires STI screening?  Yes  Has patient been screened once for HCV in the past?  No  No results found for: HCVAB  Does the patient have current drug use (including MJ), have a partner with drug use, and/or has been incarcerated since last result? No  If yes-- Screen for HCV through [facility]   Does the patient meet criteria for HBV testing? No  Criteria:  -Household, sexual or needle sharing contact with HBV -History of drug use -HIV positive -Those with known Hep C   Health Maintenance Due  Topic Date Due  . Hepatitis C Screening  Never done  . COVID-19 Vaccine (1) Never done  . TETANUS/TDAP  Never done  . PAP-Cervical Cytology Screening  06/17/2019  . PAP SMEAR-Modifier  06/17/2019    Review of Systems  All other systems reviewed and are negative.   The following portions of the patient's history were reviewed  and updated as appropriate: allergies, current medications, past family history, past medical history, past social history, past surgical history and problem list. Problem list updated.   See flowsheet for other program required questions.  Objective:   Vitals:   09/24/20 1549  BP: 105/69  Pulse: 68  Weight: 177 lb 6.4 oz (80.5 kg)  Height: 5\' 4"  (1.626 m)    Physical Exam Vitals and nursing note reviewed.  Constitutional:      General: She is not in acute distress.    Appearance: Normal appearance.  HENT:     Head: Normocephalic and atraumatic.     Mouth/Throat:     Mouth: Mucous membranes are moist.     Pharynx: Oropharynx is clear. No oropharyngeal exudate or posterior oropharyngeal erythema.  Eyes:     Conjunctiva/sclera: Conjunctivae normal.  Neck:     Thyroid: No thyroid mass, thyromegaly or thyroid tenderness.  Cardiovascular:     Rate and Rhythm: Normal rate and regular rhythm.  Pulmonary:     Effort: Pulmonary effort is normal.     Breath sounds: Normal breath sounds.  Chest:  Breasts:     Right: Normal. No mass, nipple discharge, skin change, tenderness, axillary adenopathy or supraclavicular adenopathy.     Left: Normal. No mass, nipple discharge, skin change, tenderness, axillary adenopathy or supraclavicular adenopathy.    Abdominal:     Palpations: Abdomen is  soft. There is no mass.     Tenderness: There is no abdominal tenderness. There is no guarding or rebound.  Genitourinary:    General: Normal vulva.     Rectum: Normal.     Comments: External genitalia/pubic area without nits, lice, edema, erythema, lesions and inguinal adenopathy. Vagina with normal mucosa and discharge. Cervix without visible lesions. IUD strings visualized and palpated. Uterus firm, mobile, nt, no masses, no CMT, no adnexal tenderness or fullness. Musculoskeletal:     Cervical back: Neck supple. No tenderness.  Lymphadenopathy:     Cervical: No cervical adenopathy.     Upper  Body:     Right upper body: No supraclavicular, axillary or pectoral adenopathy.     Left upper body: No supraclavicular, axillary or pectoral adenopathy.  Skin:    General: Skin is warm and dry.     Findings: No bruising, erythema, lesion or rash.  Neurological:     Mental Status: She is alert and oriented to person, place, and time.  Psychiatric:        Mood and Affect: Mood normal.        Behavior: Behavior normal.        Thought Content: Thought content normal.        Judgment: Judgment normal.       Assessment and Plan:  Kristin Haley is a 30 y.o. female presenting to the [facility] Department for an initial annual wellness/contraceptive visit  Contraception counseling: Reviewed all forms of birth control options in the tiered based approach. available including abstinence; over the counter/barrier methods; hormonal contraceptive medication including pill, patch, ring, injection,contraceptive implant, ECP; hormonal and nonhormonal IUDs; permanent sterilization options including vasectomy and the various tubal sterilization modalities. Risks, benefits, and typical effectiveness rates were reviewed.  Questions were answered.  Written information was also given to the patient to review.  Patient desires to continue with her IUD , this was prescribed for patient. She will follow up in  1 year and prn for surveillance.  She was told to call with any further questions, or with any concerns about this method of contraception.  Emphasized use of condoms 100% of the time for STI prevention.  Patient was not a candidate for ECP today.   1. Encounter for counseling regarding contraception Reviewed with patient normal SE of IUD and when to call clinic for concerns. Enc condoms with all sex for STD protection.  2. Screening for STD (sexually transmitted disease) Await test results.  Counseled that RN will call if needs to RTC for treatment once results are back.  -  Chlamydia/Gonorrhea [place] [facility] - HIV/HCV [place] [facility] - Syphilis Serology, [place] [facility]  3. Well woman exam with routine gynecological exam Reviewed with patient healthy habits to maintain general health. Enc MVI 1 po daily. Enc to establish with/ follow up with PCP for primary care concerns, age appropriate screenings and illness. Await pap results.  Counseled that RN will call or send letter once results are back for follow up plan.  - IGP, rfx Aptima HPV ASCU  4. Surveillance of previously prescribed intrauterine contraceptive device Continue with current Mirena until desires removal or removal is due.     Return in about 1 year (around 09/24/2021) for RP and prn.  No future appointments.  Matt Holmes, PA

## 2020-09-26 LAB — IGP, RFX APTIMA HPV ASCU: PAP Smear Comment: 0

## 2020-10-01 LAB — HM HIV SCREENING LAB: HM HIV Screening: NEGATIVE

## 2020-10-01 LAB — HM HEPATITIS C SCREENING LAB: HM Hepatitis Screen: NEGATIVE

## 2021-10-08 ENCOUNTER — Encounter: Payer: Self-pay | Admitting: Family Medicine

## 2021-10-08 ENCOUNTER — Ambulatory Visit (LOCAL_COMMUNITY_HEALTH_CENTER): Payer: Medicaid Other | Admitting: Family Medicine

## 2021-10-08 VITALS — BP 111/66 | Ht 64.0 in | Wt 174.8 lb

## 2021-10-08 DIAGNOSIS — Z113 Encounter for screening for infections with a predominantly sexual mode of transmission: Secondary | ICD-10-CM

## 2021-10-08 DIAGNOSIS — Z30431 Encounter for routine checking of intrauterine contraceptive device: Secondary | ICD-10-CM

## 2021-10-08 DIAGNOSIS — Z3009 Encounter for other general counseling and advice on contraception: Secondary | ICD-10-CM

## 2021-10-08 LAB — WET PREP FOR TRICH, YEAST, CLUE
Trichomonas Exam: NEGATIVE
Yeast Exam: NEGATIVE

## 2021-10-08 LAB — HM HIV SCREENING LAB: HM HIV Screening: NEGATIVE

## 2021-10-08 NOTE — Progress Notes (Signed)
Patient seen for a PE, STD screening. Wet prep reviewed. No tx per standing orders.  ?

## 2021-10-08 NOTE — Progress Notes (Signed)
[facility] DEPARTMENT ?Family Planning Clinic ?[address] [employer] ?Main Number: [phone] ? ?Family Planning Visit- Repeat Yearly Visit ? ?Subjective:  ?Kristin Haley is a 31 y.o. G2P2002  being seen today for an annual wellness visit and to discuss contraception options.   The patient is currently using IUD or IUS for pregnancy prevention. Patient does not want a pregnancy in the next year.  ? ? report they are looking for a method that provides Cycle control, High efficacy at preventing pregnancy, and Minimal bleeding/improved bleeding profile ? ? ?Patient has the following medical problems: has Prenatal genetic counseling on their problem list. ? ?Chief Complaint  ?Patient presents with  ? Annual Exam  ? ? ?Patient reports she is here for her annual exam and IUD string check ? ?Patient denies concerns. ? ?See flowsheet for other program required questions.  ? ?Body mass index is 30 kg/m?. - Patient is eligible for diabetes screening based on BMI and age >89?  not applicable ?HA1C ordered? not applicable ? ?Patient reports 1 of partners in last year. Desires STI screening?  Yes ? ? ?Has patient been screened once for HCV in the past?  No ? No results found for: HCVAB ? ?Does the patient have current of drug use, have a partner with drug use, and/or has been incarcerated since last result? No  ?If yes-- Screen for HCV through [facility] ?  ?Does the patient meet criteria for HBV testing? No ? ?Criteria:  ?-Household, sexual or needle sharing contact with HBV ?-History of drug use ?-HIV positive ?-Those with known Hep C ? ? ?Health Maintenance Due  ?Topic Date Due  ? COVID-19 Vaccine (1) Never done  ? PAP-Cervical Cytology Screening  06/17/2019  ? ? ?Review of Systems  ?All other systems reviewed and are negative. ? ?The following portions of the patient's history were reviewed and updated as appropriate: allergies, current medications, past family history, past medical history, past social  history, past surgical history and problem list. Problem list updated. ? ?Objective:  ? ?Vitals:  ? 10/08/21 1005  ?BP: 111/66  ?Weight: 174 lb 12.8 oz (79.3 kg)  ?Height: 5\' 4"  (1.626 m)  ? ? ?Physical Exam ?Vitals and nursing note reviewed.  ?Constitutional:   ?   Appearance: Normal appearance.  ?HENT:  ?   Head: Normocephalic.  ?Pulmonary:  ?   Effort: Pulmonary effort is normal.  ?Abdominal:  ?   Hernia: There is no hernia in the left inguinal area or right inguinal area.  ?Genitourinary: ?   General: Normal vulva.  ?   Exam position: Lithotomy position.  ?   Labia:     ?   Right: No rash, tenderness, lesion or injury.     ?   Left: No rash, tenderness, lesion or injury.   ?   Urethra: No prolapse.  ?   Vagina: Normal.  ?   Cervix: Normal.  ?   Comments: Bimanual not indicated. ?Musculoskeletal:     ?   General: Normal range of motion.  ?Lymphadenopathy:  ?   Lower Body: No right inguinal adenopathy. No left inguinal adenopathy.  ?Skin: ?   General: Skin is warm and dry.  ?Neurological:  ?   Mental Status: She is alert. Mental status is at baseline.  ? ? ? ? ?Assessment and Plan:  ?Kristin Haley is a 31 y.o. female G2P2002 presenting to the [facility] Department for an yearly wellness and contraception visit ? ? ?  Contraception counseling: Reviewed options based on patient desire and reproductive life plan. Patient is interested in IUD or IUS. This was provided to the patient today.  ? ?Risks, benefits, and typical effectiveness rates were reviewed.  Questions were answered.  Written information was also given to the patient to review.   ? ?The patient will follow up in  1 years for surveillance.  The patient was told to call with any further questions, or with any concerns about this method of contraception.  Emphasized use of condoms 100% of the time for STI prevention. ? ?Patient is not an ECP candidate. ? ? ?1. Family planning ? ?IUD string noted.  Encouraged client to check for  IUD strings  monthly ?Pap due 2024 ? ?2. Screening examination for venereal disease ? ?- WET PREP FOR TRICH, YEAST, CLUE ?- Chlamydia/Gonorrhea [place] [facility] ?- HIV [place] [facility] ?- Syphilis Serology, [place] [facility] ? ?3. Surveillance of previously prescribed intrauterine contraceptive device ? ? ? ? ? ?No follow-ups on file. ? ?No future appointments. ? ?Larene Pickett, FNP ?

## 2022-04-03 ENCOUNTER — Ambulatory Visit: Payer: 59

## 2022-04-03 DIAGNOSIS — Z23 Encounter for immunization: Secondary | ICD-10-CM

## 2022-04-23 ENCOUNTER — Ambulatory Visit: Payer: 59

## 2022-04-23 DIAGNOSIS — Z23 Encounter for immunization: Secondary | ICD-10-CM

## 2022-12-19 ENCOUNTER — Encounter: Payer: Self-pay | Admitting: Family Medicine

## 2022-12-19 ENCOUNTER — Ambulatory Visit: Payer: 59 | Admitting: Family Medicine

## 2022-12-19 VITALS — BP 108/66 | HR 68 | Ht 64.0 in | Wt 178.2 lb

## 2022-12-19 DIAGNOSIS — Z01419 Encounter for gynecological examination (general) (routine) without abnormal findings: Secondary | ICD-10-CM

## 2022-12-19 DIAGNOSIS — Z3009 Encounter for other general counseling and advice on contraception: Secondary | ICD-10-CM | POA: Diagnosis not present

## 2022-12-19 DIAGNOSIS — Z975 Presence of (intrauterine) contraceptive device: Secondary | ICD-10-CM | POA: Diagnosis not present

## 2022-12-19 DIAGNOSIS — Z113 Encounter for screening for infections with a predominantly sexual mode of transmission: Secondary | ICD-10-CM

## 2022-12-19 LAB — WET PREP FOR TRICH, YEAST, CLUE
Trichomonas Exam: NEGATIVE
Yeast Exam: NEGATIVE

## 2022-12-19 LAB — HM HIV SCREENING LAB: HM HIV Screening: NEGATIVE

## 2022-12-19 NOTE — Progress Notes (Signed)
Pt appointment for PE, string check, and STI screening. Seen by FNP Sydnee Levans. Family planning packet given and contents reviewed. Pt declined condoms. Wet prep results reviewed with pt.

## 2022-12-19 NOTE — Progress Notes (Signed)
[facility] DEPARTMENT [facility] [address] Main Number: [phone]  Family Planning Visit- Repeat Yearly Visit  Subjective:  Kristin Haley is a 32 y.o. G2P2002  being seen today for an annual wellness visit and to discuss contraception options.   The patient is currently using IUD or IUS for pregnancy prevention. Patient does not want a pregnancy in the next year.    report they are looking for a method that provides High efficacy at preventing pregnancy   Patient has the following medical problems: has Prenatal genetic counseling on their problem list.  Chief Complaint  Patient presents with   Annual Exam    STI screening    Patient reports to clinic for PE and STI testing.   Patient denies concerns about self   See flowsheet for other program required questions.   Body mass index is 30.59 kg/m. - Patient is eligible for diabetes screening based on BMI> 25 and age >35?  no HA1C ordered? not applicable  Patient reports 1 of partners in last year. Desires STI screening?  Yes   Has patient been screened once for HCV in the past?  No  No results found for: "HCVAB"  Does the patient have current of drug use, have a partner with drug use, and/or has been incarcerated since last result? No  If yes-- Screen for HCV through [facility]   Does the patient meet criteria for HBV testing? No  Criteria:  -Household, sexual or needle sharing contact with HBV -History of drug use -HIV positive -Those with known Hep C   Health Maintenance Due  Topic Date Due   COVID-19 Vaccine (1 - 2023-24 season) Never done    Review of Systems  Constitutional:  Negative for weight loss.  Eyes:  Negative for blurred vision.  Respiratory:  Negative for cough and shortness of breath.   Cardiovascular:  Negative for claudication.  Gastrointestinal:  Negative for nausea.  Genitourinary:  Negative for dysuria and frequency.  Skin:  Negative for  rash.  Neurological:  Negative for headaches.  Endo/Heme/Allergies:  Does not bruise/bleed easily.    The following portions of the patient's history were reviewed and updated as appropriate: allergies, current medications, past family history, past medical history, past social history, past surgical history and problem list. Problem list updated.  Objective:   Vitals:   12/19/22 1544  BP: 108/66  Pulse: 68  Weight: 178 lb 3.2 oz (80.8 kg)  Height: 5\' 4"  (1.626 m)    Physical Exam Vitals and nursing note reviewed.  Constitutional:      Appearance: Normal appearance.  HENT:     Head: Normocephalic and atraumatic.     Mouth/Throat:     Mouth: Mucous membranes are moist.     Pharynx: Oropharynx is clear. No oropharyngeal exudate or posterior oropharyngeal erythema.  Pulmonary:     Effort: Pulmonary effort is normal.  Abdominal:     General: Abdomen is flat.     Palpations: There is no mass.     Tenderness: There is no abdominal tenderness. There is no rebound.  Genitourinary:    General: Normal vulva.     Exam position: Lithotomy position.     Pubic Area: No rash or pubic lice.      Labia:        Right: No rash or lesion.        Left: No rash or lesion.      Vagina: Normal. No vaginal discharge,  erythema, bleeding or lesions.     Cervix: Discharge present. No cervical motion tenderness, friability, lesion or erythema.     Uterus: Normal.      Adnexa: Right adnexa normal and left adnexa normal.     Rectum: Normal.     Comments: pH = 4  Blue IUD string visualized from cervical os  Mild amt of tan colored discharge present Lymphadenopathy:     Head:     Right side of head: No preauricular or posterior auricular adenopathy.     Left side of head: No preauricular or posterior auricular adenopathy.     Cervical: No cervical adenopathy.     Upper Body:     Right upper body: No supraclavicular, axillary or epitrochlear adenopathy.     Left upper body: No supraclavicular,  axillary or epitrochlear adenopathy.     Lower Body: No right inguinal adenopathy. No left inguinal adenopathy.  Skin:    General: Skin is warm and dry.     Findings: No rash.  Neurological:     Mental Status: She is alert and oriented to person, place, and time.       Assessment and Plan:  Kristin Haley is a 31 y.o. female G2P2002 presenting to the [facility] Department for an yearly wellness and contraception visit  1. Well woman exam with routine gynecological exam -CBE and Pap due in 2025 -no concerns about self today  2. Screening for venereal disease  - Chlamydia/Gonorrhea [place] [facility] - HIV [place] [facility] - Syphilis Serology, [place] [facility] - WET PREP FOR TRICH, YEAST, CLUE  3. IUD (intrauterine device) in place Contraception counseling: Reviewed options based on patient desire and reproductive life plan. Patient is interested in IUD or IUS. This was not provided to the patient today. IUD already present  Risks, benefits, and typical effectiveness rates were reviewed.  Questions were answered.  Written information was also given to the patient to review.    The patient will follow up in  1 years for surveillance.  The patient was told to call with any further questions, or with any concerns about this method of contraception.  Emphasized use of condoms 100% of the time for STI prevention.  Educated on ECP and assessed need for ECP. Not indicated- IUD in place    Return in about 1 year (around 12/19/2023) for annual well-woman exam.  No future appointments.  Lenice Llamas, [place]

## 2024-04-08 ENCOUNTER — Ambulatory Visit

## 2024-04-08 VITALS — BP 111/66 | HR 76 | Ht 64.0 in | Wt 180.0 lb

## 2024-04-08 DIAGNOSIS — Z3009 Encounter for other general counseling and advice on contraception: Secondary | ICD-10-CM

## 2024-04-08 DIAGNOSIS — Z124 Encounter for screening for malignant neoplasm of cervix: Secondary | ICD-10-CM

## 2024-04-08 DIAGNOSIS — Z308 Encounter for other contraceptive management: Secondary | ICD-10-CM | POA: Diagnosis not present

## 2024-04-08 NOTE — Progress Notes (Signed)
 [facility] [address] [place] [address] Main phone: [phone]  Family Planning Visit - Repeat Yearly Visit  Subjective:  Kristin Haley is a 33 y.o. G2P2002  being seen today for an annual wellness visit and to discuss contraception options. The patient is currently using IUD (or IUS) for pregnancy prevention. Patient does not want a pregnancy in the next year.   Patient has the following medical problems:  Patient Active Problem List   Diagnosis Date Noted   Prenatal genetic counseling 02/01/2018   Chief Complaint  Patient presents with   Annual Exam    PE/IUD check   HPI Patient reports desire for IUD check. Pap normal in 2022. HPV not done. Repeat today. Patient denies other concerns.    Review of Systems  All other systems reviewed and are negative.  See flowsheet for further details and programmatic requirements Hyperlink available at the top of the signed note in blue.  Flow sheet content below:  Pregnancy Intention Screening Does the patient want to become pregnant in the next year?: No Does the patient's partner want to become pregnant in the next year?: No Would the patient like to discuss contraceptive options today?: No Sexual History What age did you start your period?: 16 How often do you have your period?: spotting sometimes Date of last sex?: 04/01/24 Has the patient had unprotected sex within the last 5 days?: No Do you have sex with men, women, both men and women?: Men only In the past 2 months how many partners have you had sex with?: 1 In the past 12 months, how many partners have you had sex with?: 1 Is it possible that any of your sex partners in the past 12 months had sex with someone else whild they were still in a sexual relationship with you?: No What ways do you have sex?: Vaginal Do you or your partner use condoms and/or dental dams every time you have vaginal, oral  or anal sex?: Sometimes Do you douche?: No Date of last HIV test?: 12/19/22 Have you ever had an STD?: No Have any of your partners had an STD?: No Have you or your partner ever shot up drugs?: No Have any of your partners used drugs in the past?: No Have you or your partners exchanged money or drugs for sex?: No Counseling All Patients: Typical use rates for method effectiveness (R) Education: Make informed decision about family planning, Reduce risk of transmission and protection from STD's and HIV, Understand BMI >25 or >18.5 is a health risk (weight management educational materials to be provided to client requests), Warning signs for rare but serious adverse events and what to do if they experience a warning sign (including emergency 24 hour number, where to seek emergency service outside of hours of operation), When to return for follow up (planned return schedule) Contraception Wrap Up Current Method: IUD or IUS  Diabetes screening This patient is 33 y.o. with a BMI of Body mass index is 30.9 kg/m.SABRA  Is patient eligible for diabetes screening (age >35 and BMI >25)?  no  Was Hgb A1c ordered? no  STI screening Patient reports 1 of partners in last year.  Does this patient desire STI screening?  No - declines  Cervical Cancer Screening  Screen today Result Date Procedure Results Follow-ups  09/24/2020 IGP, rfx Aptima HPV ASCU DIAGNOSIS:: Comment Specimen adequacy:: Comment Clinician Provided ICD10: Comment Performed by:: Comment PAP Smear Comment: . Note:: Comment  Test Methodology: Comment PAP Reflex: Comment   06/16/2016 HM PAP SMEAR HM Pap smear: Negative    Health Maintenance Due  Topic Date Due   Hepatitis B Vaccines 19-59 Average Risk (1 of 3 - 19+ 3-dose series) Never done   Cervical Cancer Screening (HPV/Pap Cotest)  09/25/2023   Influenza Vaccine  12/25/2023   COVID-19 Vaccine (1 - 2025-26 season) Never done   The following portions of the patient's history were  reviewed and updated as appropriate: allergies, current medications, past family history, past medical history, past social history, past surgical history and problem list. Problem list updated.  Objective:   Vitals:   04/08/24 1034  BP: 111/66  Pulse: 76  Weight: 180 lb (81.6 kg)  Height: 5' 4 (1.626 m)   Physical Exam Vitals and nursing note reviewed. Exam conducted with a chaperone present Brett Orange).  Constitutional:      Appearance: Normal appearance.  HENT:     Head: Normocephalic and atraumatic.     Mouth/Throat:     Mouth: Mucous membranes are moist.     Pharynx: Oropharynx is clear. No oropharyngeal exudate or posterior oropharyngeal erythema.  Cardiovascular:     Heart sounds: Normal heart sounds, S1 normal and S2 normal.  Pulmonary:     Effort: Pulmonary effort is normal.     Breath sounds: Normal breath sounds.  Chest:  Breasts:    Right: Normal. No swelling, bleeding, inverted nipple, mass, nipple discharge, skin change or tenderness.     Left: Normal. No swelling, bleeding, inverted nipple, mass, nipple discharge, skin change or tenderness.  Abdominal:     General: Abdomen is flat.     Palpations: There is no mass.     Tenderness: There is no abdominal tenderness. There is no rebound.  Genitourinary:    General: Normal vulva.     Exam position: Lithotomy position.     Pubic Area: No rash or pubic lice.      Labia:        Right: No rash or lesion.        Left: No rash or lesion.      Vagina: Normal. No vaginal discharge, erythema, bleeding or lesions.     Cervix: No cervical motion tenderness, discharge, friability, lesion or erythema.     Uterus: Normal.      Adnexa: Right adnexa normal and left adnexa normal.     Rectum: Normal.     Comments: IUD strings visualized Musculoskeletal:     Right lower leg: No edema.     Left lower leg: No edema.  Lymphadenopathy:     Head:     Right side of head: No preauricular or posterior auricular adenopathy.      Left side of head: No preauricular or posterior auricular adenopathy.     Cervical: No cervical adenopathy.     Upper Body:     Right upper body: No supraclavicular, axillary or epitrochlear adenopathy.     Left upper body: No supraclavicular, axillary or epitrochlear adenopathy.     Lower Body: No right inguinal adenopathy. No left inguinal adenopathy.  Skin:    General: Skin is warm and dry.     Findings: No rash.  Neurological:     Mental Status: She is alert and oriented to person, place, and time.    Assessment and Plan:  Kristin Haley is a 33 y.o. female G2P2002 presenting to the [facility] Department for an yearly wellness and contraception visit  Family  planning  Contraception counseling:  Reviewed options based on patient desire and reproductive life plan. Patient is interested in IUD or IUS - in place. Strings verified on exam.   Risks, benefits, and typical effectiveness rates were reviewed.  Questions were answered.  Written information was also given to the patient to review.    The patient will follow up in  1 years for surveillance.  The patient was told to call with any further questions, or with any concerns about this method of contraception.  Emphasized use of condoms 100% of the time for STI prevention.  2. Screening for cervical cancer  - NILM in 2022, HPV not done due to >30 - IGP, rfx Aptima HPV ASCU   Return in about 1 year (around 04/08/2025).  No future appointments.  Damien FORBES Satchel, NP

## 2024-04-08 NOTE — Progress Notes (Signed)
 Pt is here for PE and IUD check. Opportunity given to patient to ask questions for any clarifications. Qestions answered.  Condoms declined and FP card given. Kwadwo Jahlia Omura,RN.

## 2024-04-12 ENCOUNTER — Ambulatory Visit: Payer: Self-pay

## 2024-04-12 LAB — IGP, RFX APTIMA HPV ASCU: PAP Smear Comment: 0

## 2024-04-15 NOTE — Progress Notes (Signed)
 PAP smear reviewed, result: NILM (normal), HPV co-testing not performed. Will need HPV testing to make determination for follow up. Will call [facility] to add on.   Dorothyann Helling, MD 04/15/24  11:54 AM

## 2024-04-15 NOTE — Telephone Encounter (Signed)
 Called [facility] to add on HPV testing. Customer service rep reports this was added on yesterday and is currently running. I could not see that in my charting system. Result should be available in 2-4 days.   We will make follow up recommendations once the HPV result is in.   Dorothyann Helling, MD 04/15/24  12:03 PM

## 2024-04-17 LAB — HPV APTIMA: HPV Aptima: NEGATIVE

## 2024-04-17 LAB — SPECIMEN STATUS REPORT

## 2024-04-18 NOTE — Progress Notes (Signed)
 Pap card mailed Larraine JONELLE Novak, RN

## 2024-04-18 NOTE — Progress Notes (Signed)
 This patient's pap is normal/negative for intraepithelial lesion or malignancy and HPV was negative. She can follow up in 5 years.    Damien Satchel [facility]
